# Patient Record
Sex: Male | Born: 1995 | Race: White | Hispanic: No | Marital: Single | Smoking: Never smoker
Health system: Southern US, Community
[De-identification: ages and names within clinical notes are randomized; demographics above are authoritative.]

## PROBLEM LIST (undated history)

## (undated) VITALS — BP 102/71 | HR 132 | Temp 97.5°F | Resp 16 | Ht 65.55 in | Wt 120.2 lb

## (undated) DIAGNOSIS — Z91010 Allergy to peanuts: Secondary | ICD-10-CM

## (undated) DIAGNOSIS — F840 Autistic disorder: Secondary | ICD-10-CM

## (undated) DIAGNOSIS — T7840XA Allergy, unspecified, initial encounter: Secondary | ICD-10-CM

## (undated) HISTORY — DX: Allergy, unspecified, initial encounter: T78.40XA

## (undated) HISTORY — DX: Allergy to peanuts: Z91.010

---
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
## 2015-03-15 ENCOUNTER — Other Ambulatory Visit
Admission: RE | Admit: 2015-03-15 | Discharge: 2015-03-15 | Disposition: A | Discharge status: Home / Self Care | Payer: 59 | Source: Ambulatory Visit | Attending: Child & Adolescent Psychiatry | Admitting: Child & Adolescent Psychiatry

## 2015-03-15 DIAGNOSIS — Z79899 Other long term (current) drug therapy: Secondary | ICD-10-CM | POA: Insufficient documentation

## 2015-03-15 LAB — CBC WITH DIFFERENTIAL/PLATELET
Basophils Absolute: 0.1 10*3/uL (ref 0–0.1)
Basophils Relative: 1 %
Eosinophils Absolute: 1.2 10*3/uL — ABNORMAL HIGH (ref 0–0.7)
Eosinophils Relative: 14 %
HEMATOCRIT: 41.5 % (ref 40.0–52.0)
HEMOGLOBIN: 13.5 g/dL (ref 13.0–18.0)
LYMPHS PCT: 34 %
Lymphs Abs: 3 10*3/uL (ref 1.0–3.6)
MCH: 28.2 pg (ref 26.0–34.0)
MCHC: 32.6 g/dL (ref 32.0–36.0)
MCV: 86.5 fL (ref 80.0–100.0)
MONO ABS: 0.5 10*3/uL (ref 0.2–1.0)
MONOS PCT: 6 %
NEUTROS ABS: 3.9 10*3/uL (ref 1.4–6.5)
NEUTROS PCT: 45 %
Platelets: 298 10*3/uL (ref 150–440)
RBC: 4.8 MIL/uL (ref 4.40–5.90)
RDW: 13.9 % (ref 11.5–14.5)
WBC: 8.7 10*3/uL (ref 3.8–10.6)

## 2015-03-15 LAB — BASIC METABOLIC PANEL
Anion gap: 9 (ref 5–15)
BUN: 14 mg/dL (ref 6–20)
CO2: 29 mmol/L (ref 22–32)
Calcium: 9.8 mg/dL (ref 8.9–10.3)
Chloride: 101 mmol/L (ref 101–111)
Creatinine, Ser: 0.85 mg/dL (ref 0.61–1.24)
GFR calc Af Amer: >60 mL/min (ref >60)
GFR calc non Af Amer: >60 mL/min (ref >60)
Glucose, Bld: 102 mg/dL — ABNORMAL HIGH (ref 65–99)
Potassium: 3.9 mmol/L (ref 3.5–5.1)
SODIUM: 139 mmol/L (ref 135–145)

## 2015-03-15 LAB — AST: AST: 16 U/L (ref 15–41)

## 2015-03-15 LAB — CARBAMAZEPINE LEVEL, TOTAL: Carbamazepine Lvl: 5.2 ug/mL (ref 4.0–12.0)

## 2015-03-15 LAB — ALT: ALT: 12 U/L — ABNORMAL LOW (ref 17–63)

## 2015-04-01 ENCOUNTER — Ambulatory Visit
Admission: EM | Admit: 2015-04-01 | Discharge: 2015-04-01 | Disposition: A | Discharge status: Home / Self Care | Payer: PRIVATE HEALTH INSURANCE

## 2015-04-01 NOTE — ED Notes (Addendum)
Called to patient access desk by April Rogers to triage patient's request for a diaper change. Pt states "I need my diaper changed and I can not find anyone to help me do it, the [facility] sent me here and said I would get help." When I questioned the patient's need for a diaper change he replied "I only wear diapers in private and my mom would kill me if she knew I was here, not really kill me, but you know what I mean. I am on my mom and dad's family insurance plan so I will sign in and see the doctor. My mom calls it a fetish, but I really need my diaper changed and I can not run home wet; I am a runner. I need a male to do it, as I am not comfortable with males helping me."  Explained to patient that a male provider would be in the room with the nurse at all times. He verbalized understanding.   Remains unclear if patient has medical condition, mental or physical that warrants assistance for a diaper change.   Pt called to exam room 6 with Dr. Caro Hight present. Pt refused vital signs. Pt states he wants nurse to pull down his shorts and wipe him with wipes. Pt with erection and at this point it is clear that patient desires nurse to touch his genital area.  Nurse instructed patient that he is perfectly capable of pulling down his shorts and wiping himself. Doctor reinforced instructions and patient preceded to change himself. Nurse did assist patient to place his legs into the pull-up/diaper. Dr. Judd Gaudier explained to patient the need of the physicians and nurses are here to see sick patients. Pt verbalized understanding in a baby voice and states "I want come back here for this, it's a one time thing, and I hope all the patients here get better."

## 2015-11-29 ENCOUNTER — Other Ambulatory Visit
Admission: RE | Admit: 2015-11-29 | Discharge: 2015-11-29 | Disposition: A | Discharge status: Home / Self Care | Payer: No Typology Code available for payment source | Source: Ambulatory Visit | Attending: Psychiatry | Admitting: Psychiatry

## 2015-11-29 DIAGNOSIS — Z79899 Other long term (current) drug therapy: Secondary | ICD-10-CM | POA: Insufficient documentation

## 2015-11-29 LAB — CARBAMAZEPINE LEVEL, TOTAL: Carbamazepine Lvl: 3.9 ug/mL — ABNORMAL LOW (ref 4.0–12.0)

## 2015-11-30 LAB — LAMOTRIGINE LEVEL: LAMOTRIGINE LVL: 10.6 ug/mL (ref 2.0–20.0)

## 2022-07-27 DIAGNOSIS — Z419 Encounter for procedure for purposes other than remedying health state, unspecified: Secondary | ICD-10-CM | POA: Diagnosis not present

## 2022-08-14 ENCOUNTER — Telehealth: Payer: Self-pay

## 2022-08-14 NOTE — Telephone Encounter (Signed)
LMTCB to schedule PCP apt. AS, CMA 

## 2022-08-27 DIAGNOSIS — Z419 Encounter for procedure for purposes other than remedying health state, unspecified: Secondary | ICD-10-CM | POA: Diagnosis not present

## 2022-10-18 ENCOUNTER — Ambulatory Visit (INDEPENDENT_AMBULATORY_CARE_PROVIDER_SITE_OTHER): Payer: Medicaid Other | Admitting: Family Medicine

## 2022-10-18 ENCOUNTER — Encounter: Payer: Self-pay | Admitting: Family Medicine

## 2022-10-18 VITALS — BP 128/78 | HR 82 | Ht 67.0 in | Wt 171.8 lb

## 2022-10-18 DIAGNOSIS — F840 Autistic disorder: Secondary | ICD-10-CM

## 2022-10-18 DIAGNOSIS — Z0000 Encounter for general adult medical examination without abnormal findings: Secondary | ICD-10-CM | POA: Insufficient documentation

## 2022-10-18 DIAGNOSIS — F3181 Bipolar II disorder: Secondary | ICD-10-CM

## 2022-10-18 DIAGNOSIS — Z7689 Persons encountering health services in other specified circumstances: Secondary | ICD-10-CM | POA: Diagnosis not present

## 2022-10-18 DIAGNOSIS — Z91010 Allergy to peanuts: Secondary | ICD-10-CM

## 2022-10-18 HISTORY — DX: Persons encountering health services in other specified circumstances: Z76.89

## 2022-10-18 MED ORDER — EPINEPHRINE 0.3 MG/0.3ML IJ SOAJ: 0.3000 mg | INTRAMUSCULAR | 9 refills | Status: AC | PRN

## 2022-10-18 NOTE — Patient Instructions (Signed)
-   Review information attached regarding EpiPen usage - Return in 1 month for physical
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
## 2022-10-18 NOTE — Assessment & Plan Note (Signed)
See additional assessment(s) for plan details. 
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
## 2022-10-18 NOTE — Assessment & Plan Note (Signed)
Chronic issue the same being managed by outside psychiatry group, stable on current regimen.

## 2022-11-28 ENCOUNTER — Telehealth: Payer: Self-pay | Admitting: Family Medicine

## 2022-11-28 NOTE — Telephone Encounter (Signed)
Will discuss at physical

## 2022-11-28 NOTE — Telephone Encounter (Signed)
Copied from [place] [phone]. Topic: Appointment Scheduling - Scheduling Inquiry for Clinic >> Nov 28, 2022  8:46 AM Everette C wrote: Reason for CRM: The patient's mother would like for the patient to receive their tetanus shot   Please contact the patient's mother further when possible

## 2022-12-04 ENCOUNTER — Ambulatory Visit (INDEPENDENT_AMBULATORY_CARE_PROVIDER_SITE_OTHER): Payer: Medicaid Other | Admitting: Family Medicine

## 2022-12-04 ENCOUNTER — Encounter: Payer: Self-pay | Admitting: Family Medicine

## 2022-12-04 VITALS — BP 126/60 | HR 68 | Ht 67.0 in | Wt 170.2 lb

## 2022-12-04 DIAGNOSIS — Z13220 Encounter for screening for lipoid disorders: Secondary | ICD-10-CM | POA: Diagnosis not present

## 2022-12-04 DIAGNOSIS — Z1159 Encounter for screening for other viral diseases: Secondary | ICD-10-CM | POA: Diagnosis not present

## 2022-12-04 DIAGNOSIS — Z0000 Encounter for general adult medical examination without abnormal findings: Secondary | ICD-10-CM | POA: Diagnosis not present

## 2022-12-04 DIAGNOSIS — Z114 Encounter for screening for human immunodeficiency virus [HIV]: Secondary | ICD-10-CM | POA: Diagnosis not present

## 2022-12-04 DIAGNOSIS — S30811A Abrasion of abdominal wall, initial encounter: Secondary | ICD-10-CM | POA: Insufficient documentation

## 2022-12-04 DIAGNOSIS — E559 Vitamin D deficiency, unspecified: Secondary | ICD-10-CM
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
## 2022-12-04 NOTE — Assessment & Plan Note (Signed)
Annual examination completed, risk stratification labs ordered, anticipatory guidance provided.  We will follow labs once resulted. 

## 2022-12-04 NOTE — Patient Instructions (Signed)
-   Obtain fasting labs with orders provided (can have water or black coffee but otherwise no food or drink x 8 hours before labs) °- Review information provided °- Attend eye doctor annually, dentist every 6 months, work towards or maintain 30 minutes of moderate intensity physical activity at least 5 days per week, and consume a balanced diet °- Return in 1 year for physical °- Contact us for any questions between now and then °

## 2022-12-18 LAB — CBC
Hematocrit: 42.6 % (ref 37.5–51.0)
Hemoglobin: 14.2 g/dL (ref 13.0–17.7)
MCH: 28.8 pg (ref 26.6–33.0)
MCHC: 33.3 g/dL (ref 31.5–35.7)
MCV: 86 fL (ref 79–97)
Platelets: 328 10*3/uL (ref 150–450)
RBC: 4.93 x10E6/uL (ref 4.14–5.80)
RDW: 13.7 % (ref 11.6–15.4)
WBC: 7 10*3/uL (ref 3.4–10.8)

## 2022-12-18 LAB — COMPREHENSIVE METABOLIC PANEL
ALT: 24 IU/L (ref 0–44)
AST: 27 IU/L (ref 0–40)
Albumin/Globulin Ratio: 2.1 (ref 1.2–2.2)
Albumin: 5.2 g/dL (ref 4.3–5.2)
Alkaline Phosphatase: 67 IU/L (ref 44–121)
BUN/Creatinine Ratio: 10 (ref 9–20)
BUN: 9 mg/dL (ref 6–20)
Bilirubin Total: 0.4 mg/dL (ref 0.0–1.2)
CO2: 25 mmol/L (ref 20–29)
Calcium: 10.3 mg/dL — ABNORMAL HIGH (ref 8.7–10.2)
Chloride: 100 mmol/L (ref 96–106)
Creatinine, Ser: 0.94 mg/dL (ref 0.76–1.27)
Globulin, Total: 2.5 g/dL (ref 1.5–4.5)
Glucose: 93 mg/dL (ref 70–99)
Potassium: 4.3 mmol/L (ref 3.5–5.2)
Sodium: 139 mmol/L (ref 134–144)
Total Protein: 7.7 g/dL (ref 6.0–8.5)
eGFR: 115 mL/min/{1.73_m2} (ref >59)

## 2022-12-18 LAB — LIPID PANEL
Chol/HDL Ratio: 2.9 ratio (ref 0.0–5.0)
Cholesterol, Total: 187 mg/dL (ref 100–199)
HDL: 65 mg/dL (ref >39)
LDL Chol Calc (NIH): 108 mg/dL — ABNORMAL HIGH (ref 0–99)
Triglycerides: 75 mg/dL (ref 0–149)
VLDL Cholesterol Cal: 14 mg/dL (ref 5–40)

## 2022-12-18 LAB — HIV ANTIBODY (ROUTINE TESTING W REFLEX): HIV Screen 4th Generation wRfx: NONREACTIVE

## 2022-12-18 LAB — TSH: TSH: 1.91 u[IU]/mL (ref 0.450–4.500)

## 2022-12-18 LAB — HEPATITIS C ANTIBODY: Hep C Virus Ab: NONREACTIVE

## 2022-12-18 LAB — VITAMIN D 25 HYDROXY (VIT D DEFICIENCY, FRACTURES): Vit D, 25-Hydroxy: 48.1 ng/mL (ref 30.0–100.0)

## 2023-02-27 ENCOUNTER — Telehealth: Payer: Self-pay | Admitting: Family Medicine

## 2023-02-27 NOTE — Telephone Encounter (Signed)
Copied from CRM [id number]. Topic: General - Other >> Feb 27, 2023  4:33 PM Ja-Kwan M wrote: Reason for CRM: [place] with [employer] reports that the second page of the paperwork that was faxed back does not include Dr. Ashley Royalty signature and date. Cb# [phone]

## 2023-03-01 NOTE — Telephone Encounter (Signed)
Fax completed and faxed back.  KP

## 2023-07-24 ENCOUNTER — Emergency Department
Admission: EM | Admit: 2023-07-24 | Discharge: 2023-07-24 | Disposition: A | Discharge status: Home / Self Care | Payer: MEDICAID | Attending: Emergency Medicine | Admitting: Emergency Medicine

## 2023-07-24 ENCOUNTER — Emergency Department: Payer: MEDICAID

## 2023-07-24 ENCOUNTER — Other Ambulatory Visit: Payer: Self-pay

## 2023-07-24 DIAGNOSIS — S0081XA Abrasion of other part of head, initial encounter: Secondary | ICD-10-CM | POA: Insufficient documentation

## 2023-07-24 DIAGNOSIS — T07XXXA Unspecified multiple injuries, initial encounter: Secondary | ICD-10-CM

## 2023-07-24 DIAGNOSIS — F840 Autistic disorder: Secondary | ICD-10-CM | POA: Insufficient documentation

## 2023-07-24 DIAGNOSIS — S60511A Abrasion of right hand, initial encounter: Secondary | ICD-10-CM | POA: Diagnosis not present

## 2023-07-24 DIAGNOSIS — S80211A Abrasion, right knee, initial encounter: Secondary | ICD-10-CM | POA: Diagnosis not present

## 2023-07-24 DIAGNOSIS — Y92410 Unspecified street and highway as the place of occurrence of the external cause: Secondary | ICD-10-CM | POA: Insufficient documentation

## 2023-07-24 DIAGNOSIS — S0990XA Unspecified injury of head, initial encounter: Secondary | ICD-10-CM | POA: Diagnosis present

## 2023-07-24 MED ORDER — BACITRACIN ZINC 500 UNIT/GM EX OINT
TOPICAL_OINTMENT | Freq: Two times a day (BID) | CUTANEOUS | Status: DC
  Administered 2023-07-24: 2 via TOPICAL
  Filled 2023-07-24: qty 1.8
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
## 2023-08-06 ENCOUNTER — Ambulatory Visit: Payer: MEDICAID | Admitting: Family Medicine

## 2023-08-06 ENCOUNTER — Encounter: Payer: Self-pay | Admitting: Family Medicine

## 2023-08-06 DIAGNOSIS — M7582 Other shoulder lesions, left shoulder: Secondary | ICD-10-CM | POA: Insufficient documentation

## 2023-08-06 MED ORDER — MELOXICAM 15 MG PO TABS: 15.0000 mg | ORAL_TABLET | Freq: Every day | ORAL | 0 refills | Status: DC

## 2023-08-06 NOTE — Assessment & Plan Note (Addendum)
RHD patient notes that the left shoulder and arm pain began after lifting heavy trash cans at work and has been exacerbated by running. The patient has been managing the pain with over-the-counter ibuprofen and Tylenol, with varying degrees of relief.  Left Shoulder Pain -rotator cuff tendinitis with focality to the supraspinatus Likely tendonitis, possibly related to overuse at work. Full strength in rotator cuff but pain with supraspinatus isolation. -Prescribe Meloxicam for two weeks, with daily use for the first week and as needed dosing for the second week. -Advise to limit use of left arm, especially for strenuous activities. -Advise to perform home exercises for shoulder rehabilitation.  [facility] shoulder conditioning program materials provided. -Check in two weeks if pain persists, can consider x-ray for recalcitrant symptoms.
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
## 2024-03-24 ENCOUNTER — Encounter: Payer: Self-pay | Admitting: Family Medicine

## 2024-03-24 ENCOUNTER — Ambulatory Visit (INDEPENDENT_AMBULATORY_CARE_PROVIDER_SITE_OTHER): Payer: MEDICAID | Admitting: Family Medicine

## 2024-03-24 VITALS — BP 120/78 | HR 95 | Ht 67.0 in | Wt 175.6 lb

## 2024-03-24 DIAGNOSIS — M47818 Spondylosis without myelopathy or radiculopathy, sacral and sacrococcygeal region: Secondary | ICD-10-CM | POA: Diagnosis not present

## 2024-03-24 MED ORDER — MELOXICAM 15 MG PO TABS: 15.0000 mg | ORAL_TABLET | Freq: Every day | ORAL | 0 refills | Status: DC

## 2024-03-24 NOTE — Progress Notes (Signed)
 Primary Care / Sports Medicine Office Visit  Patient Information:  Patient ID: Carl Dyer, male DOB: 09-29-1995 Age: 28 y.o. MRN: [id number]   Carl Dyer is a pleasant 28 y.o. male presenting with the following:  Chief Complaint  Patient presents with   Back Pain    Mid to low back pain x 3 weeks. Occasionally has sharp shooting pains. Patient has been taking tylenol  and ibuprofen to relieve the pain. Bending and lifting are aggravating factors. No xray's.    Vitals:   03/24/24 1428  BP: 120/78  Pulse: 95  SpO2: 98%   Vitals:   03/24/24 1428  Weight: 175 lb 9.6 oz (79.7 kg)  Height: 5' 7 (1.702 m)   Body mass index is 27.5 kg/m.  No results found.   Independent interpretation of notes and tests performed by another provider:   None  Procedures performed:   None  Pertinent History, Exam, Impression, and Recommendations:   Problem List Items Addressed This Visit     Arthropathy of right sacroiliac joint - Primary   History of Present Illness REESE Dyer is a 28 year old male who presents with acute right-sided lower back pain.  Acute right-sided lower back pain - Onset approximately three weeks ago while pulling weeds at work - Initially improved over a weekend, then worsened after slipping off a sidewalk and lifting over 100 bales of hay - Pain is primarily localized to the right lower back, occasionally radiating upwards into the back - Pain is described as 'shooting' and more pronounced on the right side - Ibuprofen and application of a cold frozen water bottle provide partial relief - No prior history of back pain - No numbness or tingling in the legs - No current radiation of pain down the leg  Radicular symptoms - Pain radiated down the right leg to the thigh area for approximately one week, now resolved  Functional impact and exacerbating factors - Able to perform tasks such as lying on the ground without significant pain - Prolonged  activities, bending down, or standing for extended periods exacerbate symptoms  Medication use - Currently taking ibuprofen for pain management - No other medication use related to this issue  Musculoskeletal history - History of hamstring issues, not related to current back pain  Physical Exam Lumbar Spine Exam INSPECTION: Not documented PALPATION: Maximal tenderness at the right sacroiliac joint, Non-tender midline and left sacroiliac joint, Paraspinal muscular tenderness in the lower lumbar region RANGE OF MOTION (ROM) ASSESSMENT: Full, unrestricted NEUROMUSCULAR: Grossly intact SPECIAL TESTS: Negative straight leg raise on the right with reproduction of lower lumbar pain, Negative FADIR and piriformis tests on the right, Positive FABER and Kemp's tests localizing to the right, Negative iliopsoas test on the right  Assessment and Plan Right sacroiliac joint irritation with inflammation Acute right sacroiliac joint pain with inflammation, exacerbated by activity. Ibuprofen efficacy suggests inflammation. - Discontinue ibuprofen. - Start meloxicam  15 mg once daily with food for one week, then as needed for pain. - Advise light duty work for one to two weeks. - Provide exercises to start gently and gradually advance. - Recommend cold therapy for inflammation and optional heat therapy for muscle soreness. - Schedule follow-up appointment in four weeks.        Orders & Medications Medications:  Meds ordered this encounter  Medications   meloxicam  (MOBIC ) 15 MG tablet    Sig: Take 1 tablet (15 mg total) by mouth daily. X 7 days  then daily as-needed    Dispense:  14 tablet    Refill:  0   No orders of the defined types were placed in this encounter.    No follow-ups on file.     Selinda JINNY Ku, MD, [facility]   Primary Care Sports Medicine Primary Care and Sports Medicine at [facility]

## 2024-03-24 NOTE — Patient Instructions (Signed)
 Patient Plan for Post-Visit Guidance  1. Stop taking ibuprofen. 2. Start meloxicam  15 mg once daily with food for one week, then use as needed for pain. 3. Limit work to [employer] duty for one to two weeks. 4. Begin the provided exercises gently and gradually increase as tolerated. 5. Use cold therapy for inflammation and heat therapy if you have muscle soreness. 6. Schedule a follow-up appointment in four weeks.  Red Flags: - If you experience new or worsening pain, swelling, redness, fever, or difficulty moving, contact the office immediately.

## 2024-03-24 NOTE — Assessment & Plan Note (Signed)
 History of Present Illness Carl Dyer is a 28 year old male who presents with acute right-sided lower back pain.  Acute right-sided lower back pain - Onset approximately three weeks ago while pulling weeds at work - Initially improved over a weekend, then worsened after slipping off a sidewalk and lifting over 100 bales of hay - Pain is primarily localized to the right lower back, occasionally radiating upwards into the back - Pain is described as 'shooting' and more pronounced on the right side - Ibuprofen and application of a cold frozen water bottle provide partial relief - No prior history of back pain - No numbness or tingling in the legs - No current radiation of pain down the leg  Radicular symptoms - Pain radiated down the right leg to the thigh area for approximately one week, now resolved  Functional impact and exacerbating factors - Able to perform tasks such as lying on the ground without significant pain - Prolonged activities, bending down, or standing for extended periods exacerbate symptoms  Medication use - Currently taking ibuprofen for pain management - No other medication use related to this issue  Musculoskeletal history - History of hamstring issues, not related to current back pain  Physical Exam Lumbar Spine Exam INSPECTION: Not documented PALPATION: Maximal tenderness at the right sacroiliac joint, Non-tender midline and left sacroiliac joint, Paraspinal muscular tenderness in the lower lumbar region RANGE OF MOTION (ROM) ASSESSMENT: Full, unrestricted NEUROMUSCULAR: Grossly intact SPECIAL TESTS: Negative straight leg raise on the right with reproduction of lower lumbar pain, Negative FADIR and piriformis tests on the right, Positive FABER and Kemp's tests localizing to the right, Negative iliopsoas test on the right  Assessment and Plan Right sacroiliac joint irritation with inflammation Acute right sacroiliac joint pain with inflammation, exacerbated  by activity. Ibuprofen efficacy suggests inflammation. - Discontinue ibuprofen. - Start meloxicam  15 mg once daily with food for one week, then as needed for pain. - Advise light duty work for one to two weeks. - Provide exercises to start gently and gradually advance. - Recommend cold therapy for inflammation and optional heat therapy for muscle soreness. - Schedule follow-up appointment in four weeks.

## 2024-03-27 ENCOUNTER — Ambulatory Visit (INDEPENDENT_AMBULATORY_CARE_PROVIDER_SITE_OTHER): Payer: MEDICAID | Admitting: Family Medicine

## 2024-03-27 ENCOUNTER — Encounter: Payer: Self-pay | Admitting: Family Medicine

## 2024-03-27 VITALS — BP 104/64 | HR 88 | Ht 67.0 in | Wt 175.0 lb

## 2024-03-27 DIAGNOSIS — M47818 Spondylosis without myelopathy or radiculopathy, sacral and sacrococcygeal region: Secondary | ICD-10-CM | POA: Diagnosis not present

## 2024-03-30 NOTE — Assessment & Plan Note (Signed)
 History of Present Illness Carl Dyer is a 28 year old male who presents with back pain and is seeking clearance to return to work.  Lumbosacral pain - Jolting pain throughout the body, previously more severe, now improved to a minor bruise-like sensation - Persistent pain localized to the right sacroiliac (SI) joint region - Pain is less severe than before but remains sore - Able to perform basic activities such as bending and doing dishes, though pain persists - Able to feel a stretch in the SI joint area when performing exercises correctly  Response to analgesic therapy - Currently taking meloxicam  for pain management  Rehabilitation and exercise adherence - Initiated exercises provided at the last visit  Physical Exam PALPATION: Right sacroiliac joint with interval improvement, now minimal tenderness to palpation. No crepitus, effusion, warmth, nodules, or bony abnormalities.  Assessment and Plan Right sacroiliac joint pain Pain improved, now bruise-like. Meloxicam  and exercises effective. Able to perform basic activities with discomfort. - Continue meloxicam  for one more week, then use once daily as needed.  Take with food. - Perform both sets of exercises as instructed, focusing on hamstring stretches and sacroiliac joint exercises. - Return to work without restrictions starting Monday. - Monitor pain levels and report if pain worsens despite medication and exercises. - Contact us  for any persistent or recurrent pain, otherwise follow-up as needed.

## 2024-03-30 NOTE — Patient Instructions (Signed)
 Patient Plan for Post-Visit Guidance  Right Sacroiliac Joint Pain  - Continue taking meloxicam  for one more week, then use once daily as needed. Always take with food. - Perform both sets of exercises as instructed, focusing on hamstring stretches and sacroiliac joint exercises. - Return to work without restrictions starting Monday. - Monitor your pain levels and report if pain worsens despite medication and exercises. - Contact the office for any persistent or recurrent pain, otherwise follow up as needed.  Red Flags: - If you develop severe pain, numbness, weakness, loss of bladder or bowel control, or any new or worsening symptoms, contact the office immediately.

## 2024-03-30 NOTE — Progress Notes (Signed)
     Primary Care / Sports Medicine Office Visit  Patient Information:  Patient ID: Carl Dyer, male DOB: [birth year] Age: 28 y.o. MRN: [id number]   Carl Dyer is a pleasant 28 y.o. male presenting with the following:  Chief Complaint  Patient presents with   Back Pain    Patient is requesting note to go back to work on  Monday 03/30/24.    Vitals:   03/27/24 1600  BP: 104/64  Pulse: 88  SpO2: 97%   Vitals:   03/27/24 1600  Weight: 175 lb (79.4 kg)  Height: 5' 7 (1.702 m)   Body mass index is 27.41 kg/m.  No results found.   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  Lumbosacral pain - Jolting pain throughout the body, previously more severe, now improved to a minor bruise-like sensation - Persistent pain localized to the right sacroiliac (SI) joint region - Pain is less severe than before but remains sore - Able to perform basic activities such as bending and doing dishes, though pain persists - Able to feel a stretch in the SI joint area when performing exercises correctly  Response to analgesic therapy - Currently taking meloxicam  for pain management  Rehabilitation and exercise adherence - Initiated exercises provided at the last visit  Physical Exam PALPATION: Right sacroiliac joint with interval improvement, now minimal tenderness to palpation. No crepitus, effusion, warmth, nodules, or bony abnormalities.  Assessment and Plan Right sacroiliac joint pain Pain improved, now bruise-like. Meloxicam  and exercises effective. Able to perform basic activities with discomfort. - Continue meloxicam   for one more week, then use once daily as needed.  Take with food. - Perform both sets of exercises as instructed, focusing on hamstring stretches and sacroiliac joint exercises. - Return to work without restrictions starting Monday. - Monitor pain levels and report if pain worsens despite medication and exercises. - Contact us  for any persistent or recurrent pain, otherwise follow-up as needed.        Orders & Medications Medications: No orders of the defined types were placed in this encounter.  No orders of the defined types were placed in this encounter.    Return if symptoms worsen or fail to improve.     Selinda JINNY Ku, MD, [facility]   Primary Care Sports Medicine Primary Care and Sports Medicine at [facility]

## 2024-04-24 ENCOUNTER — Encounter: Payer: Self-pay | Admitting: Family Medicine

## 2024-04-24 ENCOUNTER — Ambulatory Visit (INDEPENDENT_AMBULATORY_CARE_PROVIDER_SITE_OTHER): Payer: Worker's Compensation | Admitting: Family Medicine

## 2024-04-24 VITALS — BP 110/60 | HR 90 | Ht 67.0 in | Wt 179.2 lb

## 2024-04-24 DIAGNOSIS — M47818 Spondylosis without myelopathy or radiculopathy, sacral and sacrococcygeal region: Secondary | ICD-10-CM

## 2024-04-24 NOTE — Progress Notes (Signed)
     Primary Care / Sports Medicine Office Visit  Patient Information:  Patient ID: Carl Dyer, male DOB: 1996/01/17 Age: 28 y.o. MRN: [id number]   Carl Dyer is a pleasant 28 y.o. male presenting with the following:  Chief Complaint  Patient presents with   Back Pain    Patient presents today for a f/u on his back. His back is healed up.     Vitals:   04/24/24 1448  BP: 110/60  Pulse: 90  SpO2: 98%   Vitals:   04/24/24 1448  Weight: 179 lb 3.2 oz (81.3 kg)  Height: 5' 7 (1.702 m)   Body mass index is 28.07 kg/m.  No results found.   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  Sacroiliac joint and low back pain - Low back pain localized to the sacroiliac joints has resolved over the past week - No current back pain - No use of medication for back pain  Lower extremity soreness - Soreness in legs when bending, not associated with back pain  Foot discomfort with activity - Foot soreness when jumping in worn-out ('dead') shoes - Soreness limited to feet and does not affect the back  Physical Exam INSPECTION: No midline spinous process tenderness, no paraspinal lumbar tenderness. PALPATION: Bilateral SI joints non-tender. RANGE OF MOTION: Normal range of motion. SPECIAL TESTS: Stork test benign bilaterally. Hop test negative bilaterally.  Assessment and Plan Resolved low back pain Low back pain has resolved with no pain in the SI joints or lumbar region. Negative findings on physical examination including bilateral SI joint tenderness, midline spinous process tenderness, paraspinal lumbar  tenderness, Stork test, and hop test. Full range of motion without pain. No current medication use for back pain. - Continue prescribed exercises to maintain flexibility and prevent recurrence. - Focus on stretching the hamstrings to alleviate pressure on the back. - Can dose OTC medications for pain if needed. - No follow-up appointment necessary unless symptoms recur.        Orders & Medications Medications: No orders of the defined types were placed in this encounter.  No orders of the defined types were placed in this encounter.    No follow-ups on file.     Selinda JINNY Ku, MD, [facility]   Primary Care Sports Medicine Primary Care and Sports Medicine at [facility]

## 2024-04-24 NOTE — Patient Instructions (Signed)
 Patient Plan for Post-Visit Guidance  Low Back Pain (Resolved) - Continue prescribed exercises to maintain flexibility and prevent recurrence. - Focus on stretching your hamstrings regularly. - Use over-the-counter pain medication if needed. - No follow-up appointment is needed unless symptoms return.  Red Flags - Seek medical attention if you develop new or severe back pain, numbness, tingling, weakness in your legs, loss of bladder or bowel control, or any new or worsening symptoms.

## 2024-04-24 NOTE — Assessment & Plan Note (Signed)
 History of Present Illness Carl Dyer is a 28 year old male who presents for follow-up of low back pain related to the SI joints.  Sacroiliac joint and low back pain - Low back pain localized to the sacroiliac joints has resolved over the past week - No current back pain - No use of medication for back pain  Lower extremity soreness - Soreness in legs when bending, not associated with back pain  Foot discomfort with activity - Foot soreness when jumping in worn-out ('dead') shoes - Soreness limited to feet and does not affect the back  Physical Exam INSPECTION: No midline spinous process tenderness, no paraspinal lumbar tenderness. PALPATION: Bilateral SI joints non-tender. RANGE OF MOTION: Normal range of motion. SPECIAL TESTS: Stork test benign bilaterally. Hop test negative bilaterally.  Assessment and Plan Resolved low back pain Low back pain has resolved with no pain in the SI joints or lumbar region. Negative findings on physical examination including bilateral SI joint tenderness, midline spinous process tenderness, paraspinal lumbar tenderness, Stork test, and hop test. Full range of motion without pain. No current medication use for back pain. - Continue prescribed exercises to maintain flexibility and prevent recurrence. - Focus on stretching the hamstrings to alleviate pressure on the back. - Can dose OTC medications for pain if needed. - No follow-up appointment necessary unless symptoms recur.

## 2024-08-24 ENCOUNTER — Ambulatory Visit: Admitting: Family Medicine

## 2024-08-24 ENCOUNTER — Encounter: Payer: Self-pay | Admitting: Family Medicine

## 2024-08-24 VITALS — BP 90/60 | HR 88 | Ht 67.0 in

## 2024-08-24 DIAGNOSIS — R112 Nausea with vomiting, unspecified: Secondary | ICD-10-CM | POA: Diagnosis not present

## 2024-08-24 DIAGNOSIS — J0100 Acute maxillary sinusitis, unspecified: Secondary | ICD-10-CM | POA: Diagnosis not present

## 2024-08-24 LAB — POC COVID19 BINAXNOW: SARS Coronavirus 2 Ag: NEGATIVE

## 2024-08-24 LAB — POCT INFLUENZA A/B
Influenza A, POC: NEGATIVE
Influenza B, POC: NEGATIVE

## 2024-08-24 MED ORDER — ONDANSETRON 4 MG PO TBDP: 4.0000 mg | ORAL_TABLET | Freq: Three times a day (TID) | ORAL | 0 refills | Status: DC | PRN

## 2024-08-24 MED ORDER — MOMETASONE FUROATE 50 MCG/ACT NA SUSP: 2.0000 | Freq: Two times a day (BID) | NASAL | 0 refills | Status: AC

## 2024-08-24 MED ORDER — AZELASTINE HCL 0.1 % NA SOLN: 2.0000 | Freq: Two times a day (BID) | NASAL | 0 refills | Status: AC

## 2024-08-24 NOTE — Progress Notes (Signed)
 "    Primary Care / Sports Medicine Office Visit  Patient Information:  Patient ID: Carl Dyer, male DOB: September 04, 1995 Age: 28 y.o. MRN: [id number]   Carl Dyer is a pleasant 28 y.o. male presenting with the following:  Chief Complaint  Patient presents with   Emesis    Vomiting, nausea, dizziness x 3 days after taking medications.     Vitals:   08/24/24 1100  BP: 90/60  Pulse: 88  SpO2: 96%   Vitals:   08/24/24 1100  Height: 5' 7 (1.702 m)   Body mass index is 28.07 kg/m.  No results found.   Discussed the use of AI scribe software for clinical note transcription with the patient, who gave verbal consent to proceed.   Independent interpretation of notes and tests performed by another provider:   None  Procedures performed:   None  Pertinent History, Exam, Impression, and Recommendations:   History of Present Illness Carl Dyer is a 28 year old male who presents with three days of worsening nausea, vomiting, and diarrhea.  Nausea and vomiting - Three days of progressively worsening morning nausea and vomiting - Vomiting episodes are most severe in the morning after taking medications - Emesis is liquid, brown, with minimal food content - No hematemesis or coffee ground appearance - Unable to tolerate usual breakfast foods - Worst episode lasted approximately ninety minutes on the third day - Decreased oral intake due to persistent nausea and vomiting  Diarrhea - Three days of infrequent, cloudy, liquid diarrhea - No hematochezia or mucus in stool - Diarrhea is so cloudy that stool characteristics are difficult to assess - No recent dietary changes or new supplements  Upper respiratory symptoms - Congestion, sinus pressure, and intermittent cough - Fever on Christmas Eve - Persistent malaise since fever onset - Attributes some cough to allergies  Medication use and recent changes - Takes lamotrigine  and clonazepam regularly with no recent changes  in dosing - Takes Zyrtec in the morning and at night for at least a year  Physical Exam HEENT: Tympanic membranes and canals benign bilaterally, oropharynx with cobblestone pattern, mild tenderness in left maxillary sinus. ABDOMEN: Soft, nontender, nondistended, normoactive bowel sounds, no hepatosplenomegaly  Results Labs Influenza (08/24/2024): Negative COVID-19 (08/24/2024): Negative  Assessment and Plan Acute gastroenteritis with nausea, vomiting, and diarrhea Acute onset of nausea, vomiting, and diarrhea likely due to excessive mucus ingestion from upper respiratory infection. - Prescribed ondansetron as needed for nausea. - Recommended bland diet and avoidance of heavy or dairy foods. - Advised increased oral hydration. - Provided anticipatory guidance on food reintroduction. - Instructed to seek urgent care if unable to maintain hydration or if symptoms worsen. - Sent dietary instructions via MyChart.  Acute upper respiratory infection with sinus congestion Recent upper respiratory symptoms likely viral, contributing to gastrointestinal issues. Symptoms are more consistent with a viral upper respiratory infection. COVID-19 and influenza tests negative. - Prescribed mometasone nasal spray twice daily for at least one week. - Prescribed azelastine nasal spray. - Recommended over-the-counter guaifenesin with increased water intake. - Advised to monitor symptoms and follow up if not improved by next week. - Confirmed no contraindications with current medications. - Sent prescriptions to pharmacy with pickup instructions.  Problem List Items Addressed This Visit   None Visit Diagnoses       Acute maxillary sinusitis, recurrence not specified    -  Primary   Relevant Medications   mometasone (NASONEX) 50 MCG/ACT nasal spray  azelastine (ASTELIN) 0.1 % nasal spray     Nausea and vomiting, unspecified vomiting type       Relevant Medications   ondansetron (ZOFRAN-ODT) 4 MG  disintegrating tablet   Other Relevant Orders   POC COVID-19 BinaxNow (Completed)   POCT Influenza A/B (Completed)        Orders & Medications Medications:  Meds ordered this encounter  Medications   mometasone (NASONEX) 50 MCG/ACT nasal spray    Sig: Place 2 sprays into the nose in the morning and at bedtime.    Dispense:  7.5 mL    Refill:  0   azelastine (ASTELIN) 0.1 % nasal spray    Sig: Place 2 sprays into both nostrils 2 (two) times daily. Use in each nostril as directed    Dispense:  30 mL    Refill:  0   ondansetron (ZOFRAN-ODT) 4 MG disintegrating tablet    Sig: Take 1 tablet (4 mg total) by mouth every 8 (eight) hours as needed for up to 10 days for nausea or vomiting.    Dispense:  20 tablet    Refill:  0   Orders Placed This Encounter  Procedures   POC COVID-19 BinaxNow   POCT Influenza A/B     No follow-ups on file.     Selinda JINNY Ku, MD, [facility]   Primary Care Sports Medicine Primary Care and Sports Medicine at [facility]   "

## 2024-08-24 NOTE — Patient Instructions (Signed)
 VISIT SUMMARY:  You visited us  today due to three days of worsening nausea, vomiting, and diarrhea, along with upper respiratory symptoms like congestion and sinus pressure. We discussed your symptoms and provided treatment plans to help you feel better.  YOUR PLAN:  NAUSEA, VOMITING, AND DIARRHEA: You have been experiencing nausea, vomiting, and diarrhea likely due to excessive mucus ingestion from your upper respiratory infection. There is a risk of dehydration. -Take ondansetron as needed for nausea. -Follow a bland diet and avoid heavy or dairy foods. -Increase your oral hydration. -Follow the food reintroduction guidance provided. -Seek urgent care if you are unable to maintain hydration or if your symptoms worsen. -Check MyChart for detailed dietary instructions.  ACUTE UPPER RESPIRATORY INFECTION WITH SINUS CONGESTION: Your upper respiratory symptoms are likely due to a viral infection, which is also contributing to your gastrointestinal issues. COVID-19 and influenza tests were negative. -Use mometasone nasal spray twice daily for at least one week. -Use azelastine nasal spray as prescribed. -Take over-the-counter guaifenesin and increase your water intake. -Monitor your symptoms and follow up if they do not improve by next week. -Your prescriptions have been sent to the pharmacy with pickup instructions.

## 2024-08-28 ENCOUNTER — Other Ambulatory Visit: Payer: Self-pay

## 2024-08-28 ENCOUNTER — Encounter: Payer: Self-pay | Admitting: Family Medicine

## 2024-08-28 MED ORDER — ONDANSETRON HCL 4 MG PO TABS: 4.0000 mg | ORAL_TABLET | Freq: Three times a day (TID) | ORAL | 0 refills | Status: AC | PRN

## 2024-08-28 NOTE — Telephone Encounter (Signed)
 Please send regular (not ODT) version of Zofran with otherwise same instructions and quantity. Thanks, JJM

## 2024-08-28 NOTE — Telephone Encounter (Signed)
 Please review.  KP

## 2024-08-31 ENCOUNTER — Ambulatory Visit: Admitting: Family Medicine
